# Patient Record
Sex: Male | Born: 1955 | Race: White | Hispanic: No | Marital: Married | Smoking: Current every day smoker
Health system: Southern US, Community
[De-identification: ages and names within clinical notes are randomized; demographics above are authoritative.]

## PROBLEM LIST (undated history)

## (undated) ENCOUNTER — Ambulatory Visit: Admission: EM | Source: Home / Self Care

## (undated) HISTORY — PX: FOOT MASS EXCISION: SHX1663

---

## 2011-08-27 ENCOUNTER — Encounter: Payer: Self-pay | Admitting: Family Medicine

## 2011-08-27 ENCOUNTER — Inpatient Hospital Stay (INDEPENDENT_AMBULATORY_CARE_PROVIDER_SITE_OTHER)
Admission: RE | Admit: 2011-08-27 | Discharge: 2011-08-27 | Disposition: A | Discharge status: Home / Self Care | Payer: 59 | Source: Ambulatory Visit | Attending: Family Medicine | Admitting: Family Medicine

## 2011-08-27 DIAGNOSIS — J069 Acute upper respiratory infection, unspecified: Secondary | ICD-10-CM

## 2011-08-27 DIAGNOSIS — Z8679 Personal history of other diseases of the circulatory system: Secondary | ICD-10-CM | POA: Insufficient documentation

## 2011-08-27 DIAGNOSIS — R197 Diarrhea, unspecified: Secondary | ICD-10-CM

## 2011-08-27 LAB — CONVERTED CEMR LAB: Rapid Strep: NEGATIVE

## 2011-10-17 NOTE — Progress Notes (Signed)
Summary: CONGESTION/ACHES/GI ISSUES (room 5)   Vital Signs:  Patient Profile:   55 Years Old Male CC:      congestion and GI concerns x 5 days Height:     71 inches Weight:      202 pounds O2 Sat:      97 % O2 treatment:    Room Air Temp:     98.9 degrees F oral Pulse rate:   81 / minute Resp:     16 per minute BP sitting:   125 / 80  (left arm) Cuff size:   regular  Vitals Entered By: Lavell Islam RN (August 27, 2011 11:08 AM)                  Updated Prior Medication List: * LAMICTAL  TABS (LAMOTRIGINE) 50 mg q am 100 mg q pm * KLONOPIN  TABS (CLONAZEPAM) 1/2 tab q am 1/2 tab q pm * ASA 81 mg q am  Current Allergies: ! * GREEN BEANSHistory of Present Illness Chief Complaint: congestion and GI concerns x 5 days History of Present Illness:  Subjective: Patient complains of URI symptoms that started 5 days ago with sinus congestion and mild sore throat.  Two days ago he developed diarrhea that lasted one day, although he again had loose stools this morning, resolved.  No blood in stool.  He has had a vague abdominal discomfort and bloating.  No nausea/vomiting.  He smokes 1 pack per day  + cough, productive No pleuritic pain + wheezing + post-nasal drainage No sinus pain/pressure No itchy/red eyes No earache No hemoptysis No SOB No fever/chills No nausea No vomiting No skin rashes + fatigue + myalgias No headache Used OTC meds without relief   REVIEW OF SYSTEMS  Eyes       Complains of eye pain. Ear/Nose/Throat/Mouth       Complains of frequent runny nose, sinus problems, and sore throat.  Respiratory       Complains of dry cough.  Cardiovascular       Complains of tires easily with exhertion.    Gastrointestinal       Complains of stomach pain, nausea/vomiting, and diarrhea.      Comments: no vomiting Neurological       Complains of headaches.   Other Comments: congestion and GI issues x 5 days   Past History:  Past Medical  History: Transient ischemic attack, hx of 01/1998  Past Surgical History: both feet  Family History: Family History of CAD Male  Family History Diabetes  Social History: Current Smoker Alcohol use-yes Drug use-no Smoking Status:  current Drug Use:  no   Objective:  No acute distress, alert and oriented  Eyes:  Pupils are equal, round, and reactive to light and accomodation.  Extraocular movement is intact.  Conjunctivae are not inflamed.  Ears:  Canals normal.  Tympanic membranes normal.   Nose:  Mildly congested turbinates.  No sinus tenderness  Pharynx:  Minimal erythema Neck:  Supple.  Slightly tender shotty posterior nodes are palpated bilaterally. Lungs:   Bibasilar wheezes anteriorly (somewhat worse on left).  Breath sounds are equal.   Heart:  Regular rate and rhythm without murmurs, rubs, or gallops.  Abdomen:  Nontender without masses or hepatosplenomegaly.  Bowel sounds are present.  No CVA or flank tenderness.  Extremities:  No edema.   Skin:  No rash CBC:  WBC 8.0 ; LY 21.9, MO 4.8, GR 73.3; Hgb 15.9 Rapid strep test negative  Assessment New Problems: DIARRHEA, ACUTE (ICD-787.91) UPPER RESPIRATORY INFECTION, ACUTE, WITH BRONCHITIS (ICD-465.9) FAMILY HISTORY DIABETES 1ST DEGREE RELATIVE (ICD-V18.0) FAMILY HISTORY OF CAD MALE 1ST DEGREE RELATIVE <50 (ICD-V17.3) TRANSIENT ISCHEMIC ATTACK, HX OF (ICD-V12.50)   Plan New Medications/Changes: PROAIR HFA 108 (90 BASE) MCG/ACT AERS (ALBUTEROL SULFATE) Two inhalations q4-6hr as needed.  Max 12 puffs/day  #1 MDI x 0, 08/27/2011, Donna Christen MD BENZONATATE 200 MG CAPS (BENZONATATE) One by mouth hs as needed cough  #12 x 0, 08/27/2011, Donna Christen MD AZITHROMYCIN 250 MG TABS (AZITHROMYCIN) Two tabs by mouth on day 1, then 1 tab daily on days 2 through 5  #6 tabs x 0, 08/27/2011, Donna Christen MD  New Orders: Pulse Oximetry (single measurment) [94760] Rapid Strep [16109] CBC w/Diff [60454-09811] New Patient Level  IV [99204] Services provided After hours-Weekends-Holidays [99051] Planning Comments:   Recommend clear liquids for 12 to 18 hours, then slowly advance diet Begin Z-pack, expectorant/decongestant, topical decongestant, cough suppressant at bedtime, albuterol inhaler.  Increase fluid intake Followup with PCP if not improving 7 to 10 days   The patient and/or caregiver has been counseled thoroughly with regard to medications prescribed including dosage, schedule, interactions, rationale for use, and possible side effects and they verbalize understanding.  Diagnoses and expected course of recovery discussed and will return if not improved as expected or if the condition worsens. Patient and/or caregiver verbalized understanding.  Prescriptions: PROAIR HFA 108 (90 BASE) MCG/ACT AERS (ALBUTEROL SULFATE) Two inhalations q4-6hr as needed.  Max 12 puffs/day  #1 MDI x 0   Entered and Authorized by:   Donna Christen MD   Signed by:   Donna Christen MD on 08/27/2011   Method used:   Print then Give to Patient   RxID:   [id number] BENZONATATE 200 MG CAPS (BENZONATATE) One by mouth hs as needed cough  #12 x 0   Entered and Authorized by:   Donna Christen MD   Signed by:   Donna Christen MD on 08/27/2011   Method used:   Print then Give to Patient   RxID:   [id number] AZITHROMYCIN 250 MG TABS (AZITHROMYCIN) Two tabs by mouth on day 1, then 1 tab daily on days 2 through 5  #6 tabs x 0   Entered and Authorized by:   Donna Christen MD   Signed by:   Donna Christen MD on 08/27/2011   Method used:   Print then Give to Patient   RxID:   [id number]   Patient Instructions: 1)  Take Mucinex D (guaifenesin with decongestant) twice daily for congestion, or Mucinex plus Sudafed. 2)  Increase fluid intake, rest. 3)  May use Afrin nasal spray (or generic oxymetazoline) twice daily for about 5 days.  Also recommend using saline nasal spray several times daily and/or saline nasal irrigation. 4)   Followup with family doctor if not improving 7 to 10 days.   Orders Added: 1)  Pulse Oximetry (single measurment) [94760] 2)  Rapid Strep [74259] 3)  CBC w/Diff [56387-56433] 4)  New Patient Level IV [99204] 5)  Services provided After hours-Weekends-Holidays [99051]    Laboratory Results  Date/Time Received: August 27, 2011 11:29 AM  Date/Time Reported: August 27, 2011 11:29 AM   Other Tests  Rapid Strep: negative  Kit Test Internal QC: Negative   (Normal Range: Negative)

## 2018-01-22 ENCOUNTER — Emergency Department (INDEPENDENT_AMBULATORY_CARE_PROVIDER_SITE_OTHER): Payer: BLUE CROSS/BLUE SHIELD

## 2018-01-22 ENCOUNTER — Telehealth: Payer: Self-pay | Admitting: Emergency Medicine

## 2018-01-22 ENCOUNTER — Encounter: Payer: Self-pay | Admitting: Emergency Medicine

## 2018-01-22 ENCOUNTER — Emergency Department
Admission: EM | Admit: 2018-01-22 | Discharge: 2018-01-22 | Disposition: A | Discharge status: Home / Self Care | Payer: BLUE CROSS/BLUE SHIELD | Source: Home / Self Care | Attending: Family Medicine | Admitting: Family Medicine

## 2018-01-22 DIAGNOSIS — I700 Atherosclerosis of aorta: Secondary | ICD-10-CM

## 2018-01-22 DIAGNOSIS — J40 Bronchitis, not specified as acute or chronic: Secondary | ICD-10-CM | POA: Diagnosis not present

## 2018-01-22 MED ORDER — AZITHROMYCIN 250 MG PO TABS: ORAL_TABLET | ORAL | 0 refills | Status: DC

## 2018-01-22 MED ORDER — PREDNISONE 20 MG PO TABS: ORAL_TABLET | ORAL | 0 refills | Status: DC

## 2018-01-22 MED ORDER — BENZONATATE 200 MG PO CAPS: ORAL_CAPSULE | ORAL | 0 refills | Status: DC

## 2018-01-22 NOTE — ED Provider Notes (Signed)
[name]    CSN: [id number] Arrival date & time: 01/22/18  0932     History   Chief Complaint Chief Complaint  Patient presents with  . Cough    HPI Jeffrey Carroll is a 62 y.o. male.   About 10 days ago patient developed a dry cough, with myalgias, fatigue, and chills.  No sore throat or nasal congestion.  His cough has persisted, worse at night, and he continues to have chills. He believes that his last Tdap may have been in 2009.   The history is provided by the patient.    History reviewed. No pertinent past medical history.  Patient Active Problem List   Diagnosis Date Noted  . TRANSIENT ISCHEMIC ATTACK, HX OF 08/27/2011    History reviewed. No pertinent surgical history.     Home Medications    Prior to Admission medications   Medication Sig Start Date End Date Taking? Authorizing Provider  aspirin EC 325 MG tablet Take 325 mg by mouth daily.   Yes [provider]  clonazePAM (KLONOPIN) 0.5 MG tablet Take 0.5 mg by mouth 2 (two) times daily as needed for anxiety.   Yes [provider]  azithromycin (ZITHROMAX Z-PAK) 250 MG tablet Take 2 tabs today; then begin one tab once daily for 4 more days. 01/22/18   Lattie Haw, MD  benzonatate (TESSALON) 200 MG capsule Take one cap by mouth at bedtime as needed for cough.  May repeat in 4 to 6 hours 01/22/18   Lattie Haw, MD  predniSONE (DELTASONE) 20 MG tablet Take one tab by mouth twice daily for 4 days, then one daily for 3 days. Take with food. 01/22/18   Lattie Haw, MD    Family History History reviewed. No pertinent family history.  Social History Social History   Tobacco Use  . Smoking status: Never Smoker  . Smokeless tobacco: Never Used  Substance Use Topics  . Alcohol use: Not on file  . Drug use: Not on file     Allergies   Patient has no allergy information on record.   Review of Systems Review of Systems No sore throat + cough No pleuritic  pain No wheezing No nasal congestion No post-nasal drainage No sinus pain/pressure No itchy/red eyes No earache No hemoptysis No SOB ? fever, + chills/sweats No nausea No vomiting No abdominal pain No diarrhea No urinary symptoms No skin rash + fatigue + myalgias + headache Used OTC meds without relief   Physical Exam Triage Vital Signs ED Triage Vitals [01/22/18 1003]  Enc Vitals Group     BP 135/81     Pulse Rate 78     Resp      Temp 99.5 F (37.5 C)     Temp Source Oral     SpO2 98 %     Weight 212 lb (96.2 kg)     Height      Head Circumference      Peak Flow      Pain Score 0     Pain Loc      Pain Edu?      Excl. in [place]?    No data found.  Updated Vital Signs BP 135/81 (BP Location: Right Arm)   Pulse 78   Temp 99.5 F (37.5 C) (Oral)   Wt 212 lb (96.2 kg)   SpO2 98%   BMI 29.57 kg/m   Visual Acuity Right Eye Distance:   Left Eye Distance:  Bilateral Distance:    Right Eye Near:   Left Eye Near:    Bilateral Near:     Physical Exam Nursing notes and Vital Signs reviewed. Appearance:  Patient appears stated age, and in no acute distress Eyes:  Pupils are equal, round, and reactive to light and accomodation.  Extraocular movement is intact.  Conjunctivae are not inflamed  Ears:  Canals normal.  Tympanic membranes normal.  Nose:  Mildly congested turbinates.  No sinus tenderness.   Pharynx:  Normal Neck:  Supple. No adenopathy Lungs:  Faint bilateral expiratory wheezes posteriorly.   Breath sounds are equal.  Moving air well. Heart:  Regular rate and rhythm without murmurs, rubs, or gallops.  Abdomen:  Nontender without masses or hepatosplenomegaly.  Bowel sounds are present.  No CVA or flank tenderness.  Extremities:  No edema.  Skin:  No rash present.    UC Treatments / Results  Labs (all labs ordered are listed, but only abnormal results are displayed) Labs Reviewed - No data to display  EKG  EKG Interpretation None        Radiology Dg Chest 2 View  Result Date: 01/22/2018 CLINICAL DATA:  Cough for 10 days EXAM: CHEST - 2 VIEW COMPARISON:  None. FINDINGS: Lungs are clear. Heart size and pulmonary vascularity are normal. No adenopathy. There is aortic atherosclerosis. No bone lesions. IMPRESSION: No edema or consolidation.  There is aortic atherosclerosis. Aortic Atherosclerosis (ICD10-I70.0). Electronically Signed   By: Bretta BangWilliam  Woodruff III M.D.   On: 01/22/2018 10:55    Procedures Procedures (including critical care time)  Medications Ordered in UC Medications - No data to display   Initial Impression / Assessment and Plan / UC Course  I have reviewed the triage vital signs and the nursing notes.  Pertinent labs & imaging results that were available during my care of the patient were reviewed by me and considered in my medical decision making (see chart for details).    ?pertussis. Begin Z-pak for atypical coverage, and prednisone burst/taper. Prescription written for Benzonatate [facility](Tessalon) to take at bedtime for night-time cough.  Take plain guaifenesin (1200mg  extended release tabs such as Mucinex) twice daily, with plenty of water, for cough and congestion.  Get adequate rest.   Stop all antihistamines for now, and other non-prescription cough/cold preparations. May take Delsym Cough Suppressant with Tessalon at bedtime for nighttime cough.  Followup with Family Doctor if not improved in 7 to 10 days.  Final Clinical Impressions(s) / UC Diagnoses   Final diagnoses:  Bronchitis    ED Discharge Orders        Ordered    azithromycin (ZITHROMAX Z-PAK) 250 MG tablet     01/22/18 1107    benzonatate (TESSALON) 200 MG capsule     01/22/18 1107    predniSONE (DELTASONE) 20 MG tablet     01/22/18 1107           Lattie HawBeese, Stephen A, MD 01/22/18 1110

## 2018-01-22 NOTE — Discharge Instructions (Signed)
Take plain guaifenesin (1200mg extended release tabs such as Mucinex) twice daily, with plenty of water, for cough and congestion.  Get adequate rest.   °Stop all antihistamines for now, and other non-prescription cough/cold preparations. °May take Delsym Cough Suppressant with Tessalon at bedtime for nighttime cough.  °

## 2018-01-22 NOTE — ED Triage Notes (Signed)
Pt c/o dry cough x1 week. Afebrile.

## 2019-05-09 IMAGING — DX DG CHEST 2V
2 series · 2 of 2 positions shown · non-contrast
Comparison: None.

CLINICAL DATA: Cough for 10 days

EXAM:
CHEST - 2 VIEW

[chest pa]
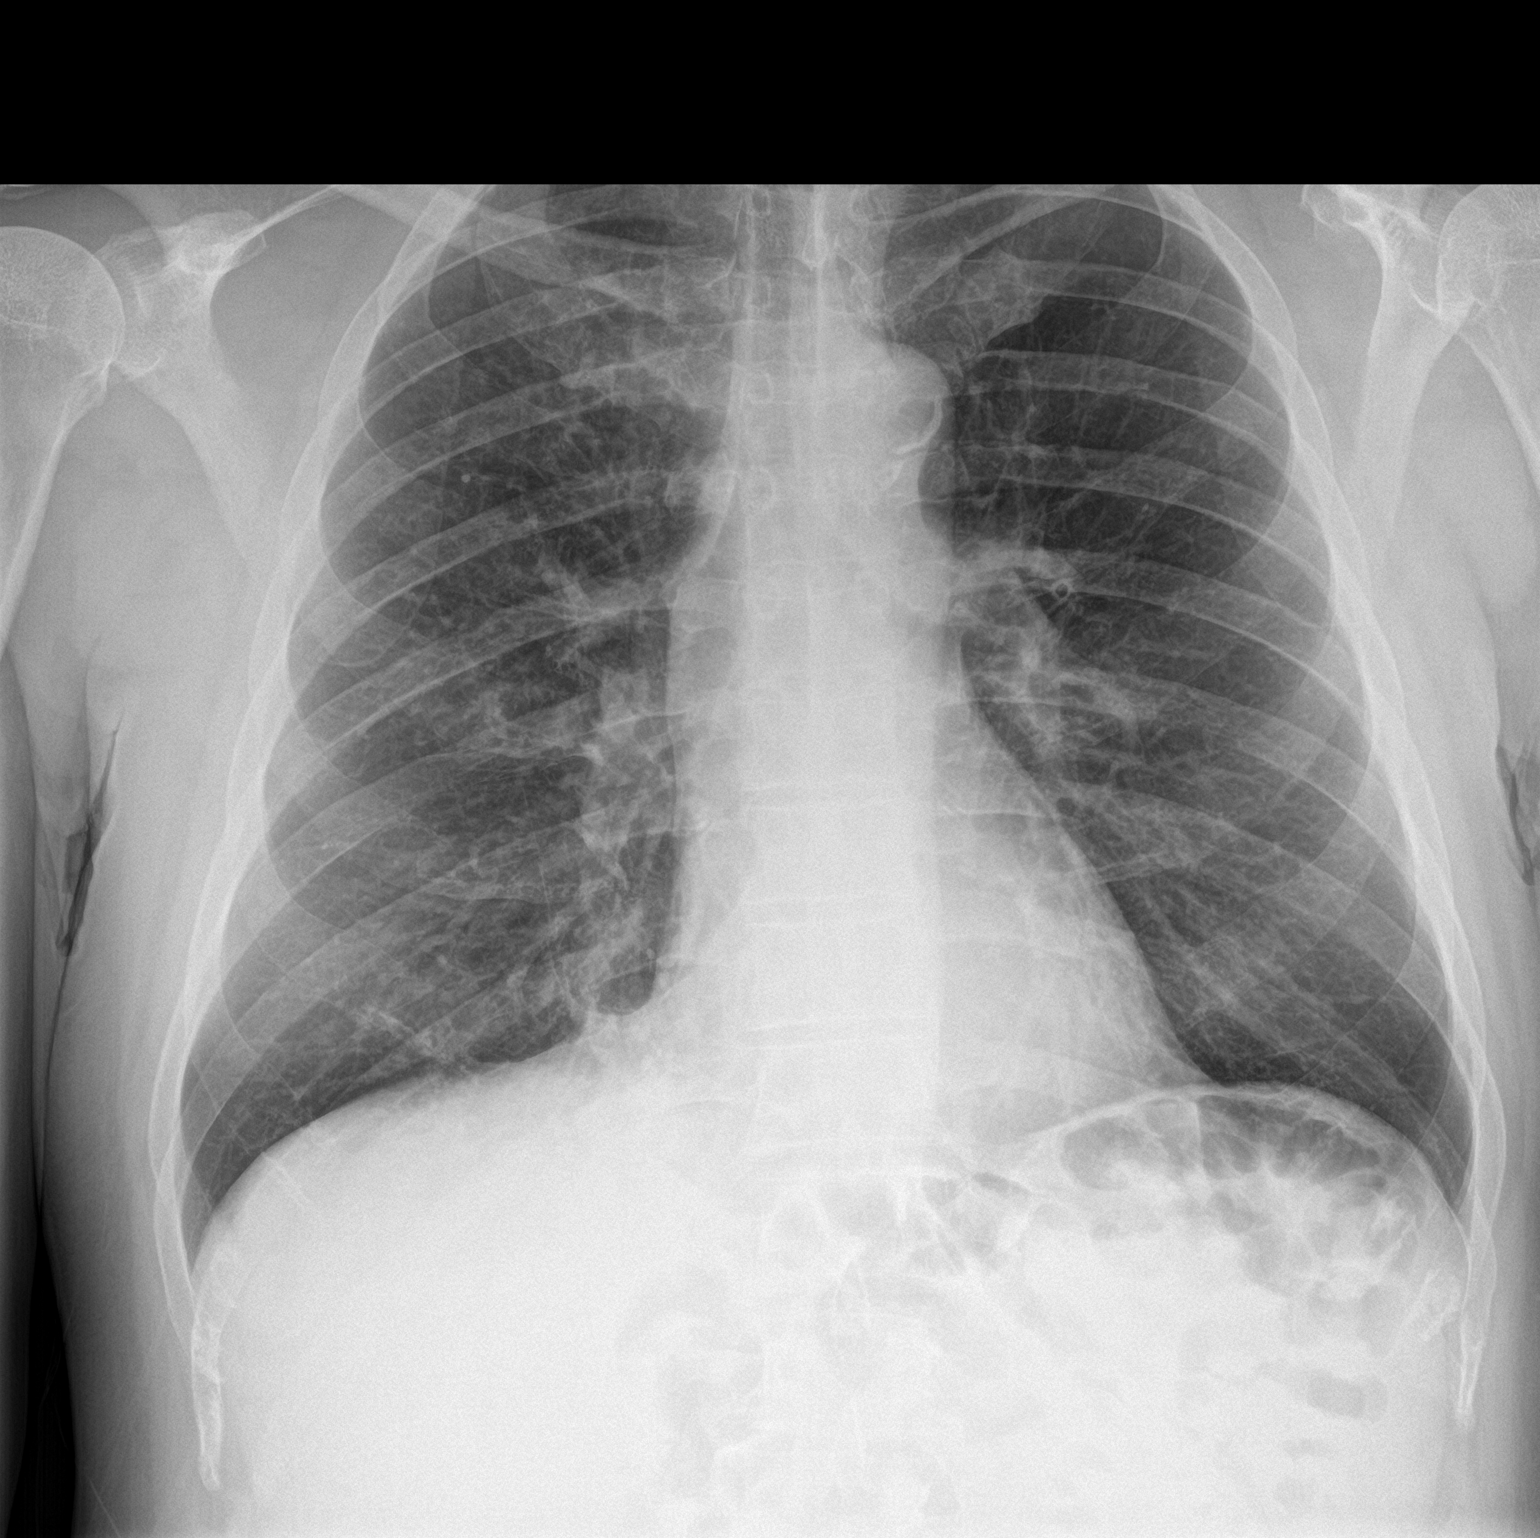

[chest lat]
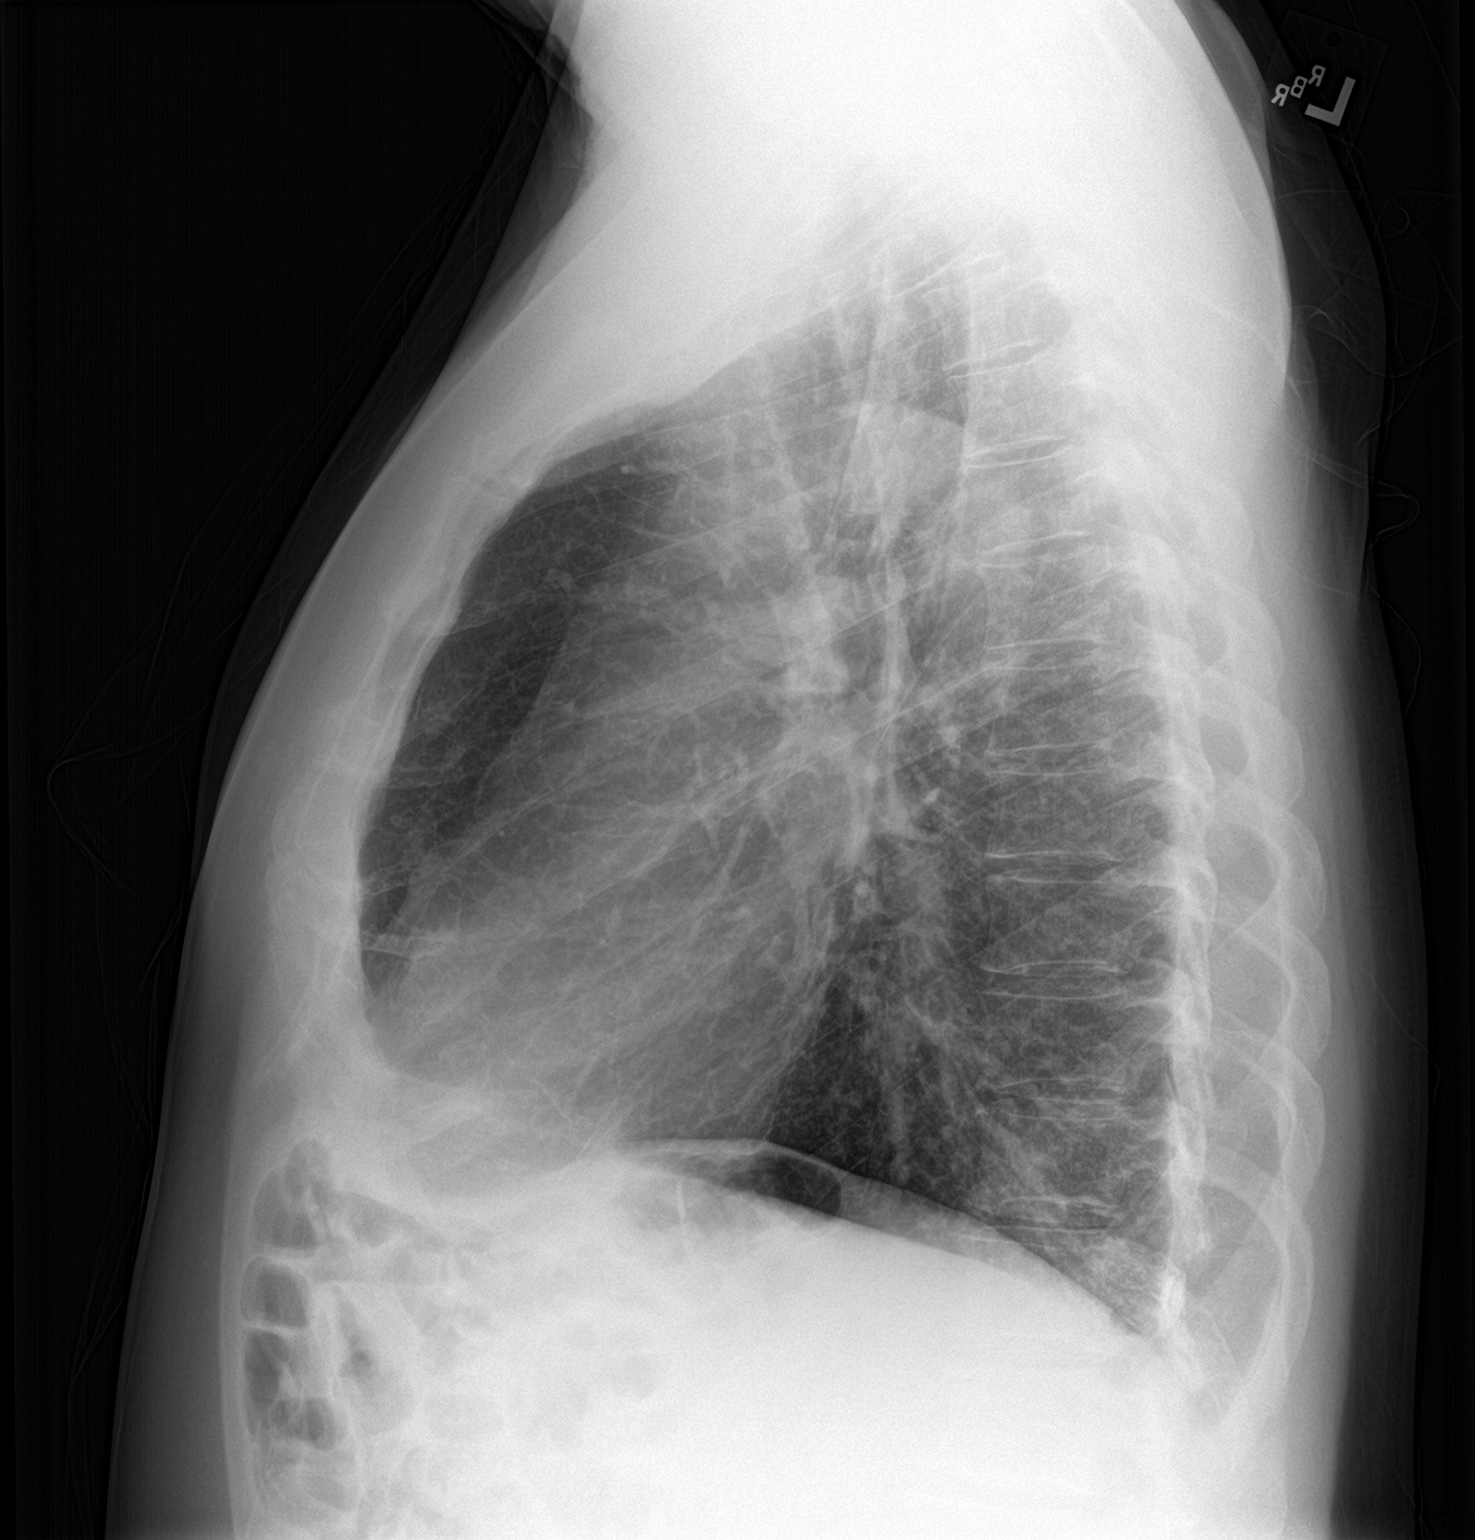

[2 of 2 positions shown; findings below may reference images not displayed]

FINDINGS: Lungs are clear. Heart size and pulmonary vascularity are normal. No
adenopathy. There is aortic atherosclerosis. No bone lesions.
IMPRESSION: No edema or consolidation.  There is aortic atherosclerosis.

Aortic Atherosclerosis ([id number]).

## 2022-11-20 ENCOUNTER — Encounter: Payer: Self-pay | Admitting: Emergency Medicine

## 2022-11-20 ENCOUNTER — Telehealth: Payer: Self-pay | Admitting: Emergency Medicine

## 2022-11-20 ENCOUNTER — Ambulatory Visit (INDEPENDENT_AMBULATORY_CARE_PROVIDER_SITE_OTHER): Payer: BC Managed Care – PPO

## 2022-11-20 ENCOUNTER — Ambulatory Visit
Admission: EM | Admit: 2022-11-20 | Discharge: 2022-11-20 | Disposition: A | Discharge status: Home / Self Care | Payer: BC Managed Care – PPO | Attending: Family Medicine | Admitting: Family Medicine

## 2022-11-20 DIAGNOSIS — J209 Acute bronchitis, unspecified: Secondary | ICD-10-CM

## 2022-11-20 DIAGNOSIS — R053 Chronic cough: Secondary | ICD-10-CM | POA: Diagnosis not present

## 2022-11-20 DIAGNOSIS — R051 Acute cough: Secondary | ICD-10-CM

## 2022-11-20 MED ORDER — DOXYCYCLINE HYCLATE 100 MG PO CAPS: 100.0000 mg | ORAL_CAPSULE | Freq: Two times a day (BID) | ORAL | 0 refills | Status: AC

## 2022-11-20 MED ORDER — AZITHROMYCIN 250 MG PO TABS: ORAL_TABLET | ORAL | 0 refills | Status: DC

## 2022-11-20 MED ORDER — PREDNISONE 20 MG PO TABS: ORAL_TABLET | ORAL | 0 refills | Status: AC

## 2022-11-20 MED ORDER — BENZONATATE 200 MG PO CAPS: ORAL_CAPSULE | ORAL | 0 refills | Status: AC

## 2022-11-20 MED ORDER — AZITHROMYCIN 250 MG PO TABS: ORAL_TABLET | ORAL | 0 refills | Status: AC

## 2022-11-20 NOTE — ED Triage Notes (Signed)
Cough x 1 month  No OTC  meds for cough  Cough is worse at night  Denies fever  Fatigue even after 14 hours of sleep last night COVID in past x 3

## 2022-11-20 NOTE — ED Provider Notes (Addendum)
[facility]    CSN: [id number] Arrival date & time: 11/20/22  1249      History   Chief Complaint Chief Complaint  Patient presents with   Cough    HPI Jeffrey Carroll is a 67 y.o. male.   HPI Patient states that he has been smoking for the better part of 40 years.  Does not have any known underlying lung disease such as asthma or emphysema.  Currently has had a cough for a month.  States it is a dry cough.  States he has bad coughing spells.  He states he coughs more at night.  States he is feeling very tired and worn out.  No specific headache or body ache.  No sweats chills or fever. Patient states he did have COVID 3 months ago.  Wonders if it damaged his lungs History reviewed. No pertinent past medical history.  Patient Active Problem List   Diagnosis Date Noted   TRANSIENT ISCHEMIC ATTACK, HX OF 08/27/2011    Past Surgical History:  Procedure Laterality Date   FOOT MASS EXCISION Bilateral        Home Medications    Prior to Admission medications   Medication Sig Start Date End Date Taking? Authorizing Provider  doxycycline (VIBRAMYCIN) 100 MG capsule Take 1 capsule (100 mg total) by mouth 2 (two) times daily. 11/20/22  Yes Eustace Moore, MD  aspirin EC 325 MG tablet Take 325 mg by mouth daily.    [provider]  azithromycin (ZITHROMAX Z-PAK) 250 MG tablet Take 2 tabs today; then begin one tab once daily for 4 more days. 11/20/22   Eustace Moore, MD  benzonatate (TESSALON) 200 MG capsule Take one cap by mouth at bedtime as needed for cough.  May repeat in 4 to 6 hours 11/20/22   Eustace Moore, MD  clonazePAM (KLONOPIN) 0.5 MG tablet Take 0.5 mg by mouth 2 (two) times daily as needed for anxiety.    [provider]  lamoTRIgine (LAMICTAL) 100 MG tablet Take 150 mg by mouth 2 (two) times daily.    [provider]  predniSONE (DELTASONE) 20 MG tablet Take one tab by mouth twice daily for 4 days, then one daily for 3 days.  Take with food. 11/20/22   Eustace Moore, MD    Family History Family History  Problem Relation Age of Onset   GI Bleed Mother    Heart attack Father     Social History Social History   Tobacco Use   Smoking status: Every Day    Packs/day: 0.50    Years: 30.00    Total pack years: 15.00    Types: Cigarettes   Smokeless tobacco: Never  Vaping Use   Vaping Use: Never used  Substance Use Topics   Alcohol use: Not Currently   Drug use: Never     Allergies   Other   Review of Systems Review of Systems See HPI  Physical Exam Triage Vital Signs ED Triage Vitals  Enc Vitals Group     BP 11/20/22 1337 138/79     Pulse Rate 11/20/22 1337 69     Resp 11/20/22 1337 18     Temp 11/20/22 1337 98.8 F (37.1 C)     Temp Source 11/20/22 1337 Oral     SpO2 11/20/22 1337 94 %     Weight 11/20/22 1341 207 lb (93.9 kg)     Height 11/20/22 1341 5\' 10"  (1.778 m)  Head Circumference --      Peak Flow --      Pain Score 11/20/22 1341 0     Pain Loc --      Pain Edu? --      Excl. in [place]? --    No data found.  Updated Vital Signs BP 138/79 (BP Location: Left Arm)   Pulse 69   Temp 98.8 F (37.1 C) (Oral)   Resp 18   Ht 5\' 10"  (1.778 m)   Wt 93.9 kg   SpO2 94%   BMI 29.70 kg/m      Physical Exam Constitutional:      General: He is not in acute distress.    Appearance: He is well-developed and normal weight. He is ill-appearing.     Comments: Patient does look tired  HENT:     Head: Normocephalic and atraumatic.     Mouth/Throat:     Mouth: Mucous membranes are moist.     Pharynx: No posterior oropharyngeal erythema.  Eyes:     Conjunctiva/sclera: Conjunctivae normal.     Pupils: Pupils are equal, round, and reactive to light.  Cardiovascular:     Rate and Rhythm: Normal rate and regular rhythm.     Heart sounds: Normal heart sounds.  Pulmonary:     Effort: Pulmonary effort is normal. No respiratory distress.     Breath sounds: Wheezing and rhonchi  present.  Abdominal:     General: There is no distension.     Palpations: Abdomen is soft.  Musculoskeletal:        General: Normal range of motion.     Cervical back: Normal range of motion.  Lymphadenopathy:     Cervical: No cervical adenopathy.  Skin:    General: Skin is warm and dry.  Neurological:     Mental Status: He is alert.      UC Treatments / Results  Labs (all labs ordered are listed, but only abnormal results are displayed) Labs Reviewed - No data to display  EKG   Radiology DG Chest 2 View  Result Date: 11/20/2022 CLINICAL DATA:  Cough for 1 month. EXAM: CHEST - 2 VIEW COMPARISON:  Chest radiograph dated 01/22/2018. FINDINGS: The heart size and mediastinal contours are within normal limits. Vascular calcifications are seen in the aortic arch. Both lungs are clear. The visualized skeletal structures are unremarkable. IMPRESSION: No active cardiopulmonary disease. Electronically Signed   By: Zerita Boers M.D.   On: 11/20/2022 14:34    Procedures Procedures (including critical care time)  Medications Ordered in UC Medications - No data to display  Initial Impression / Assessment and Plan / UC Course  I have reviewed the triage vital signs and the nursing notes.  Pertinent labs & imaging results that were available during my care of the patient were reviewed by me and considered in my medical decision making (see chart for details).     Final Clinical Impressions(s) / UC Diagnoses   Final diagnoses:  Acute bronchitis, unspecified organism  Acute cough     Discharge Instructions      Make sure that you are drinking plenty of fluids Take prednisone as directed.  This will help with wheezing and reduce coughing congestion Take the Z-Pak as directed.  This is an antibiotic.  Take 2 today then 1 a day until gone I have prescribed benzonatate for cough.  May take 2-3 times a day as needed. See your doctor if not improving by next week  At the time  of discharge patient refuses Z-Pak.  States it makes him too constipated.  I am prescribing doxycycline instead with instructions to take with food. ED Prescriptions     Medication Sig Dispense Auth. Provider   benzonatate (TESSALON) 200 MG capsule Take one cap by mouth at bedtime as needed for cough.  May repeat in 4 to 6 hours 15 capsule Eustace Moore, MD   predniSONE (DELTASONE) 20 MG tablet Take one tab by mouth twice daily for 4 days, then one daily for 3 days. Take with food. 11 tablet Eustace Moore, MD      PDMP not reviewed this encounter.   Eustace Moore, MD 11/20/22 1440    Eustace Moore, MD 11/20/22 [id number]

## 2022-11-20 NOTE — Discharge Instructions (Signed)
Make sure that you are drinking plenty of fluids Take prednisone as directed.  This will help with wheezing and reduce coughing congestion Take the Z-Pak as directed.  This is an antibiotic.  Take 2 today then 1 a day until gone I have prescribed benzonatate for cough.  May take 2-3 times a day as needed. See your doctor if not improving by next week

## 2022-11-20 NOTE — Telephone Encounter (Signed)
Call to pharmacy to cancel the azithromycin. Doxycycline sent over in place.

## 2024-06-18 LAB — COLOGUARD: COLOGUARD: NEGATIVE
